# Patient Record
Sex: Female | Born: 1955 | Race: Black or African American | Hispanic: No | Marital: Married | Smoking: Never smoker
Health system: Southern US, Community
[De-identification: ages and names within clinical notes are randomized; demographics above are authoritative.]

## PROBLEM LIST (undated history)

## (undated) DIAGNOSIS — E785 Hyperlipidemia, unspecified: Secondary | ICD-10-CM

## (undated) DIAGNOSIS — D649 Anemia, unspecified: Secondary | ICD-10-CM

---

## 2018-05-06 ENCOUNTER — Other Ambulatory Visit: Payer: Self-pay | Admitting: Family Medicine

## 2018-05-06 DIAGNOSIS — Z1231 Encounter for screening mammogram for malignant neoplasm of breast: Secondary | ICD-10-CM

## 2018-06-05 ENCOUNTER — Ambulatory Visit
Admission: RE | Admit: 2018-06-05 | Discharge: 2018-06-05 | Disposition: A | Discharge status: Home / Self Care | Payer: BC Managed Care – PPO | Source: Ambulatory Visit | Attending: Family Medicine | Admitting: Family Medicine

## 2018-06-05 DIAGNOSIS — Z1231 Encounter for screening mammogram for malignant neoplasm of breast: Secondary | ICD-10-CM

## 2019-06-09 ENCOUNTER — Other Ambulatory Visit: Payer: Self-pay | Admitting: *Deleted

## 2019-06-09 ENCOUNTER — Other Ambulatory Visit: Payer: Self-pay | Admitting: Anesthesiology

## 2019-06-09 ENCOUNTER — Other Ambulatory Visit: Payer: Self-pay

## 2019-06-09 ENCOUNTER — Other Ambulatory Visit: Payer: Self-pay | Admitting: Family Medicine

## 2019-06-09 ENCOUNTER — Other Ambulatory Visit: Payer: Self-pay | Admitting: Diagnostic Radiology

## 2019-06-09 ENCOUNTER — Other Ambulatory Visit: Payer: Self-pay | Admitting: Internal Medicine

## 2019-06-09 DIAGNOSIS — Z1231 Encounter for screening mammogram for malignant neoplasm of breast: Secondary | ICD-10-CM

## 2019-06-30 ENCOUNTER — Ambulatory Visit
Admission: RE | Admit: 2019-06-30 | Discharge: 2019-06-30 | Disposition: A | Discharge status: Home / Self Care | Payer: BC Managed Care – PPO | Source: Ambulatory Visit | Attending: Anesthesiology | Admitting: Anesthesiology

## 2019-06-30 ENCOUNTER — Other Ambulatory Visit: Payer: Self-pay

## 2019-06-30 DIAGNOSIS — Z1231 Encounter for screening mammogram for malignant neoplasm of breast: Secondary | ICD-10-CM

## 2019-12-29 ENCOUNTER — Other Ambulatory Visit: Payer: Self-pay

## 2019-12-29 ENCOUNTER — Encounter (HOSPITAL_BASED_OUTPATIENT_CLINIC_OR_DEPARTMENT_OTHER): Payer: Self-pay | Admitting: Obstetrics & Gynecology

## 2019-12-29 NOTE — H&P (Signed)
64yo PM female who presents for preop appt for [facility], D&C polypectomy on 5/12 due to:         -PMB: Today she notes that she actually has not had any bleeding recently. Previously reported that since about 2017- she will note light pink spotting any time she lifts something heavy. Typically the heavier the object the more spotting she may have. This never lasts more than 1/2 day and does not cause her to have to change/wear a pad (she wears a pad anyway). Denies spotting or bleeding any other time. She does not see blood if she exercises only when lifting. Denies vaginal discharge, itching or irritation. Denies pelvic or abdominal pain. Reports no other acute complaints        Work up:         EMB: (11/15/19): benign endometrioid type polyp        [place] (11/10/19): 8.6cm retroverted uterus with 3cm ant submucosal fibroid, post 1.4cm fibroid. Thickened endometrium 1.27cm- no BF noted, no discrete mass. Normal ovaries bilaterally. No adnexal masses seen        Per pt prior pap 2019  Current Medications  TakingRosuvastatin Calcium 5 MG Tablet 1 tablet Orally Once a day    Vitamin D 25 MCG (1000 UT) Tablet 1 tablet Orally Once a day    Iron 325 (65 Fe) MG Tablet 1 tablet Orally Once a day    Vitamin B12 100 MCG Tablet as directed Orally    Zinc 50 MG Tablet 1 tablet Orally Once a day    Fenofibrate 145 MG Tablet 1 tablet Orally Once a day    Medication List reviewed and reconciled with the patient       Past Medical History       Mixed Hyperlipidemia- on fenofibrate.        Anemia- low iron- sinc echildhood.        vitamin D def.        colonoscopy in 2016 in [facility] was good per patient.            Surgical History  none       Family History  Father: deceased 69 yrs, diagnosed with Coronary artery disease  Mother: deceased 21 yrs, childbirth  Brother 1: alive, Diabetes, Hypertension, Coronary artery disease  Sister 1: alive, Diabetes  1 brother(s) .        Social History  General:         Tobacco use               cigarettes:  Never smoked             Tobacco history last updated  12/28/2019       no EXPOSURE TO PASSIVE SMOKE.        Alcohol: yes.        no Caffeine.        no Recreational drug use.        DIET: lacking.        no Exercise.        DENTAL CARE: good.        Marital Status: married.        Children: Boys, 1, girls, 1.        EDUCATION: College.        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        COMMUNICATION BARRIERS: none.     Gyn History  Sexual  activity currently sexually active.   Periods : none , but random light spotting when lifting heavy things per pt .   LMP over 15 years ago .   Last pap smear date 06-2018 per pt .   Last mammogram date 06-2019 per pt .   Menarche unsure.   menopause early 44s per pt .      OB History  Number of pregnancies  2.   Pregnancy # 1  vaginal delivery.   Pregnancy # 2  vaginal delivery.      Allergies  Atorvastatin Calcium: muscle pain - Side Effects      Hospitalization/Major Diagnostic Procedure  No Hospitalization History.      Review of Systems  CONSTITUTIONAL:          no Chills.  no Fever.  no Night sweats.      HEENT:          Blurrred vision no, no.  no Double vision.      CARDIOLOGY:          no Chest pain.      RESPIRATORY:          no Shortness of breath.  no Cough.      UROLOGY:          no Urinary urgency.  no Urinary frequency.  no Urinary incontinence.      GASTROENTEROLOGY:          no Abdominal pain.  no Appetite change.  no Change in bowel movements.      FEMALE REPRODUCTIVE:          See HPI for details.  no Breast lumps or discharge.  no Breast pain.      NEUROLOGY:          no Dizziness.  no Headache.  no Loss of consciousness.      PSYCHOLOGY:          no Anxiety.  no Depression.      SKIN:          no Rash.  no Hives.      HEMATOLOGY/LYMPH:          no Anemia.  no Fatigue.   Using Blood Thinners no, no.          Vital Signs  Wt 198.8, Ht 63, BMI 35.21, Temp 97.6, Pulse sitting 94, BP sitting 122/80.    Examination  General Examination:        CONSTITUTIONAL: well developed, well nourished.         SKIN: warm and dry, no rashes.         NECK: supple, normal appearance.         LUNGS: clear to auscultation bilaterally, no wheezes, rhonchi, rales.         HEART: no murmurs, regular rate and rhythm.         ABDOMEN:  soft and not tender, no rebound, no rigidity.         MUSCULOSKELETAL  no calf tenderness bilaterally.         EXTREMITIES:  no edema present.         NEUROLOGIC EXAM:  alert and oriented x 3.         PSYCH:  appropriate mood and affect.     A/P: 64yo PM female who presents for hysteroscopy, D&C, myosure polypectomy -NPO -LR @ 125cc/hr -SCDs to OR -Risk/benefit and alternative reviewed including but not limited to risk of bleeding, infection and injury. Questions and concerns  were addressed and she desires to proceed  Janyth Pupa, DO [phone] (cell) [phone] (office)

## 2020-01-01 ENCOUNTER — Other Ambulatory Visit (HOSPITAL_COMMUNITY)
Admission: RE | Admit: 2020-01-01 | Discharge: 2020-01-01 | Disposition: A | Discharge status: Home / Self Care | Payer: BC Managed Care – PPO | Source: Ambulatory Visit | Attending: Obstetrics & Gynecology | Admitting: Obstetrics & Gynecology

## 2020-01-01 DIAGNOSIS — Z01812 Encounter for preprocedural laboratory examination: Secondary | ICD-10-CM | POA: Diagnosis present

## 2020-01-01 DIAGNOSIS — Z20822 Contact with and (suspected) exposure to covid-19: Secondary | ICD-10-CM | POA: Insufficient documentation

## 2020-01-01 LAB — SARS CORONAVIRUS 2 (TAT 6-24 HRS): SARS Coronavirus 2: NEGATIVE

## 2020-01-03 NOTE — Progress Notes (Signed)

## 2020-01-05 ENCOUNTER — Encounter (HOSPITAL_BASED_OUTPATIENT_CLINIC_OR_DEPARTMENT_OTHER): Payer: Self-pay | Admitting: Obstetrics & Gynecology

## 2020-01-05 ENCOUNTER — Ambulatory Visit (HOSPITAL_BASED_OUTPATIENT_CLINIC_OR_DEPARTMENT_OTHER): Payer: BC Managed Care – PPO | Admitting: Anesthesiology

## 2020-01-05 ENCOUNTER — Other Ambulatory Visit: Payer: Self-pay

## 2020-01-05 ENCOUNTER — Ambulatory Visit (HOSPITAL_BASED_OUTPATIENT_CLINIC_OR_DEPARTMENT_OTHER)
Admission: RE | Admit: 2020-01-05 | Discharge: 2020-01-05 | Disposition: A | Discharge status: Home / Self Care | Payer: BC Managed Care – PPO | Attending: Obstetrics & Gynecology | Admitting: Obstetrics & Gynecology

## 2020-01-05 ENCOUNTER — Encounter (HOSPITAL_BASED_OUTPATIENT_CLINIC_OR_DEPARTMENT_OTHER)
Admission: RE | Disposition: A | Discharge status: Home / Self Care | Payer: Self-pay | Source: Home / Self Care | Attending: Obstetrics & Gynecology

## 2020-01-05 DIAGNOSIS — N950 Postmenopausal bleeding: Secondary | ICD-10-CM | POA: Diagnosis present

## 2020-01-05 DIAGNOSIS — E559 Vitamin D deficiency, unspecified: Secondary | ICD-10-CM | POA: Diagnosis not present

## 2020-01-05 DIAGNOSIS — Z79899 Other long term (current) drug therapy: Secondary | ICD-10-CM | POA: Insufficient documentation

## 2020-01-05 DIAGNOSIS — Z888 Allergy status to other drugs, medicaments and biological substances status: Secondary | ICD-10-CM | POA: Insufficient documentation

## 2020-01-05 DIAGNOSIS — N840 Polyp of corpus uteri: Secondary | ICD-10-CM | POA: Insufficient documentation

## 2020-01-05 DIAGNOSIS — D649 Anemia, unspecified: Secondary | ICD-10-CM | POA: Diagnosis not present

## 2020-01-05 DIAGNOSIS — E782 Mixed hyperlipidemia: Secondary | ICD-10-CM | POA: Diagnosis not present

## 2020-01-05 HISTORY — PX: DILATATION & CURETTAGE/HYSTEROSCOPY WITH MYOSURE: SHX6511

## 2020-01-05 HISTORY — DX: Hyperlipidemia, unspecified: E78.5

## 2020-01-05 HISTORY — DX: Anemia, unspecified: D64.9

## 2020-01-05 SURGERY — DILATATION & CURETTAGE/HYSTEROSCOPY WITH MYOSURE
Anesthesia: General | Site: Vagina

## 2020-01-05 MED ORDER — FENTANYL CITRATE (PF) 100 MCG/2ML IJ SOLN: 25.0000 ug | INTRAMUSCULAR | Status: DC | PRN

## 2020-01-05 MED ORDER — PROPOFOL 10 MG/ML IV BOLUS
INTRAVENOUS | Status: DC | PRN
  Administered 2020-01-05: 200 mg via INTRAVENOUS

## 2020-01-05 MED ORDER — LIDOCAINE HCL (CARDIAC) PF 100 MG/5ML IV SOSY
PREFILLED_SYRINGE | INTRAVENOUS | Status: DC | PRN
  Administered 2020-01-05: 50 mg via INTRAVENOUS

## 2020-01-05 MED ORDER — ONDANSETRON HCL 4 MG/2ML IJ SOLN
INTRAMUSCULAR | Status: DC | PRN
  Administered 2020-01-05: 4 mg via INTRAVENOUS

## 2020-01-05 MED ORDER — MIDAZOLAM HCL 5 MG/5ML IJ SOLN
INTRAMUSCULAR | Status: DC | PRN
  Administered 2020-01-05: 2 mg via INTRAVENOUS

## 2020-01-05 MED ORDER — OXYCODONE HCL 5 MG/5ML PO SOLN: 5.0000 mg | Freq: Once | ORAL | Status: DC | PRN

## 2020-01-05 MED ORDER — DIPHENHYDRAMINE HCL 50 MG/ML IJ SOLN
INTRAMUSCULAR | Status: DC | PRN
  Administered 2020-01-05: 6.25 mg via INTRAVENOUS

## 2020-01-05 MED ORDER — FENTANYL CITRATE (PF) 100 MCG/2ML IJ SOLN
INTRAMUSCULAR | Status: DC | PRN
  Administered 2020-01-05: 100 ug via INTRAVENOUS

## 2020-01-05 MED ORDER — LACTATED RINGERS IV SOLN: INTRAVENOUS | Status: DC

## 2020-01-05 MED ORDER — MIDAZOLAM HCL 2 MG/2ML IJ SOLN
INTRAMUSCULAR | Status: AC
  Filled 2020-01-05: qty 2

## 2020-01-05 MED ORDER — SODIUM CHLORIDE 0.9 % IR SOLN
Status: DC | PRN
  Administered 2020-01-05: 3000 mL

## 2020-01-05 MED ORDER — FENTANYL CITRATE (PF) 100 MCG/2ML IJ SOLN
INTRAMUSCULAR | Status: AC
  Filled 2020-01-05: qty 2

## 2020-01-05 MED ORDER — LIDOCAINE-EPINEPHRINE 1 %-1:100000 IJ SOLN
INTRAMUSCULAR | Status: DC | PRN
  Administered 2020-01-05: 20 mL

## 2020-01-05 MED ORDER — DEXAMETHASONE SODIUM PHOSPHATE 4 MG/ML IJ SOLN
INTRAMUSCULAR | Status: DC | PRN
  Administered 2020-01-05: 10 mg via INTRAVENOUS

## 2020-01-05 MED ORDER — OXYCODONE HCL 5 MG PO TABS: 5.0000 mg | ORAL_TABLET | Freq: Once | ORAL | Status: DC | PRN

## 2020-01-05 MED ORDER — ONDANSETRON HCL 4 MG/2ML IJ SOLN: 4.0000 mg | Freq: Once | INTRAMUSCULAR | Status: DC | PRN

## 2020-01-05 MED ORDER — PROPOFOL 10 MG/ML IV BOLUS
INTRAVENOUS | Status: AC
  Filled 2020-01-05: qty 20

## 2020-01-05 SURGICAL SUPPLY — 18 items
CATH ROBINSON RED A/P 16FR (CATHETERS) IMPLANT
DEVICE MYOSURE LITE (MISCELLANEOUS) ×2 IMPLANT
DEVICE MYOSURE REACH (MISCELLANEOUS) IMPLANT
DILATOR CANAL MILEX (MISCELLANEOUS) IMPLANT
GAUZE 4X4 16PLY RFD (DISPOSABLE) ×2 IMPLANT
GLOVE BIOGEL PI IND STRL 6.5 (GLOVE) ×1 IMPLANT
GLOVE BIOGEL PI IND STRL 7.0 (GLOVE) ×1 IMPLANT
GLOVE BIOGEL PI INDICATOR 6.5 (GLOVE) ×1
GLOVE BIOGEL PI INDICATOR 7.0 (GLOVE) ×1
GLOVE ECLIPSE 6.5 STRL STRAW (GLOVE) ×2 IMPLANT
GOWN STRL REUS W/TWL LRG LVL3 (GOWN DISPOSABLE) ×4 IMPLANT
KIT PROCEDURE FLUENT (KITS) ×2 IMPLANT
PACK VAGINAL MINOR WOMEN LF (CUSTOM PROCEDURE TRAY) ×2 IMPLANT
PAD OB MATERNITY 4.3X12.25 (PERSONAL CARE ITEMS) ×2 IMPLANT
PAD PREP 24X48 CUFFED NSTRL (MISCELLANEOUS) ×2 IMPLANT
SEAL ROD LENS SCOPE MYOSURE (ABLATOR) ×2 IMPLANT
SLEEVE SCD COMPRESS KNEE MED (MISCELLANEOUS) ×2 IMPLANT
TOWEL GREEN STERILE FF (TOWEL DISPOSABLE) ×4 IMPLANT

## 2020-01-05 NOTE — Anesthesia Postprocedure Evaluation (Signed)
Anesthesia Post Note  Patient: Casey Hines  Procedure(s) Performed: DILATATION & CURETTAGE/HYSTEROSCOPY WITH MYOSURE (N/A Vagina )     Patient location during evaluation: PACU Anesthesia Type: General Level of consciousness: awake and alert Pain management: pain level controlled Vital Signs Assessment: post-procedure vital signs reviewed and stable Respiratory status: spontaneous breathing, nonlabored ventilation, respiratory function stable and patient connected to nasal cannula oxygen Cardiovascular status: blood pressure returned to baseline and stable Postop Assessment: no apparent nausea or vomiting Anesthetic complications: no    Last Vitals:  Vitals:   01/05/20 1245 01/05/20 1300  BP: 129/76 126/69  Pulse: 76 64  Resp: 12 14  Temp:  36.5 C  SpO2: 100% 100%    Last Pain:  Vitals:   01/05/20 1300  TempSrc:   PainSc: 0-No pain                 Mariesha Venturella COKER

## 2020-01-05 NOTE — Discharge Instructions (Addendum)
HOME INSTRUCTIONS  Please note any unusual or excessive bleeding, pain, swelling. Mild dizziness or drowsiness are normal for about 24 hours after surgery.   Shower when comfortable  Restrictions: No driving for 24 hours or while taking pain medications.  Activity:  No heavy lifting (> 10 lbs), nothing in vagina (no tampons, douching, or intercourse) x 1-2 weeks; no tub baths for 1-2 weeks Vaginal spotting is expected but if your bleeding is heavy, period like,  please call the office   Diet:  You may return to your regular diet.  Do not eat large meals.  Eat small frequent meals throughout the day.  Continue to drink a good amount of water at least 6-8 glasses of water per day, hydration is very important for the healing process.  Pain Management: Take Motrin and/or Tylenol as prescribed/needed for pain.  Always take prescription pain medication with food, it may cause constipation, increase fluids and fiber and you may want to take an over-the-counter stool softener like Colace as needed up to 2x a day.    Alcohol -- Avoid for 24 hours and while taking pain medications.  Nausea: Take sips of ginger ale or soda  Fever -- Call physician if temperature over 101 degrees  Follow up:  If you do not already have a follow up appointment scheduled, please call the office at [phone].  If you experience fever (a temperature greater than 100.4), pain unrelieved by pain medication, shortness of breath, swelling of a single leg, or any other symptoms which are concerning to you please the office immediately.    Post Anesthesia Home Care Instructions  Activity: Get plenty of rest for the remainder of the day. A responsible individual must stay with you for 24 hours following the procedure.  For the next 24 hours, DO NOT: -Drive a car -Advertising copywriter -Drink alcoholic beverages -Take any medication unless instructed by your physician -Make any legal decisions or sign important  papers.  Meals: Start with liquid foods such as gelatin or soup. Progress to regular foods as tolerated. Avoid greasy, spicy, heavy foods. If nausea and/or vomiting occur, drink only clear liquids until the nausea and/or vomiting subsides. Call your physician if vomiting continues.  Special Instructions/Symptoms: Your throat may feel dry or sore from the anesthesia or the breathing tube placed in your throat during surgery. If this causes discomfort, gargle with warm salt water. The discomfort should disappear within 24 hours.  If you had a scopolamine patch placed behind your ear for the management of post- operative nausea and/or vomiting:  1. The medication in the patch is effective for 72 hours, after which it should be removed.  Wrap patch in a tissue and discard in the trash. Wash hands thoroughly with soap and water. 2. You may remove the patch earlier than 72 hours if you experience unpleasant side effects which may include dry mouth, dizziness or visual disturbances. 3. Avoid touching the patch. Wash your hands with soap and water after contact with the patch.     Call your surgeon if you experience:   1.  Fever over 101.0. 2.  Inability to urinate. 3.  Nausea and/or vomiting. 4.  Extreme swelling or bruising at the surgical site. 5.  Continued bleeding from the incision. 6.  Increased pain, redness or drainage from the incision. 7.  Problems related to your pain medication. 8.  Any problems and/or concerns

## 2020-01-05 NOTE — Transfer of Care (Signed)
Immediate Anesthesia Transfer of Care Note  Patient: Casey Hines  Procedure(s) Performed: DILATATION & CURETTAGE/HYSTEROSCOPY WITH MYOSURE (N/A Vagina )  Patient Location: PACU  Anesthesia Type:General  Level of Consciousness: sedated  Airway & Oxygen Therapy: Patient Spontanous Breathing and Patient connected to face mask oxygen  Post-op Assessment: Report given to RN and Post -op Vital signs reviewed and stable  Post vital signs: Reviewed and stable  Last Vitals:  Vitals Value Taken Time  BP 126/71 01/05/20 1206  Temp    Pulse 84 01/05/20 1208  Resp 15 01/05/20 1208  SpO2 98 % 01/05/20 1208  Vitals shown include unvalidated device data.  Last Pain:  Vitals:   01/05/20 1036  TempSrc: Oral  PainSc: 0-No pain      Patients Stated Pain Goal: 3 (01/05/20 1036)  Complications: No apparent anesthesia complications

## 2020-01-05 NOTE — Interval H&P Note (Signed)
History and Physical Interval Note:  01/05/2020 11:06 AM  Casey Hines  has presented today for surgery, with the diagnosis of N84.0 Uterine polyp N95.0 postmenopausal bleeding.  The various methods of treatment have been discussed with the patient and family. After consideration of risks, benefits and other options for treatment, the patient has consented to  Procedure(s): DILATATION & CURETTAGE/HYSTEROSCOPY WITH MYOSURE (N/A) as a surgical intervention.  The patient's history has been reviewed, patient examined, no change in status, stable for surgery.  I have reviewed the patient's chart and labs.  Questions were answered to the patient's satisfaction.     Sharon Seller

## 2020-01-05 NOTE — Anesthesia Preprocedure Evaluation (Signed)

## 2020-01-05 NOTE — Op Note (Signed)
Operative Report  PreOp: Postmenopausal bleeding, uterine polyp PostOp: same Procedure:  Hysteroscopy, Dilation and Curettage, myosure polypectomy Surgeon: Dr. Myna Hidalgo Anesthesia: General, cervical block Complications:none EBL: 5cc UOP: 30cc IVF:800cc Discrepancy: 70cc  Findings: 8cm uterus with two small polyps noted, both ostia visualized Specimens: endometrial curettings with uterine polyps   Procedure: The patient was taken to the operating room where she underwent general anesthesia without difficulty. The patient was placed in a low lithotomy position using Allen stirrups. She was prepped and draped in the normal sterile fashion. The bladder was drained using a red rubber urethral catheter. A sterile speculum was inserted into the vagina. 20cc of Lidocaine 1% with epi was injected into the cervix- 5cc at 2, 10, 4 and 7 o'clock.  A single tooth tenaculum was placed on the anterior lip of the cervix. The uterus was then sounded to 8cm. The diagnostic hysteroscope was then inserted without difficulty and noted to have the findings as listed above. Visualization was achieved using NS as a distending medium.  Myosure was inserted and resection of the uterine polyps was completed.  The hysteroscope was removed and sharp curettage was performed. The tissue was sent to pathology. The hysteroscope was reinserted, no uterine perforation was seen. All instrument were then removed. Hemostasis was observed at the cervical site. he patient was repositioned to the supine position. The patient tolerated the procedure without any complications and taken to recovery in stable condition.   Myna Hidalgo, DO [phone] (pager) [phone] (office)

## 2020-01-05 NOTE — Anesthesia Procedure Notes (Signed)
Procedure Name: LMA Insertion Date/Time: 01/05/2020 11:34 AM Performed by: Ronnette Hila, CRNA Pre-anesthesia Checklist: Patient identified, Emergency Drugs available, Suction available and Patient being monitored Patient Re-evaluated:Patient Re-evaluated prior to induction Oxygen Delivery Method: Circle system utilized Preoxygenation: Pre-oxygenation with 100% oxygen Induction Type: IV induction Ventilation: Mask ventilation without difficulty LMA: LMA inserted LMA Size: 4.0 Number of attempts: 1 Airway Equipment and Method: Bite block Placement Confirmation: positive ETCO2 Tube secured with: Tape Dental Injury: Teeth and Oropharynx as per pre-operative assessment

## 2020-01-06 ENCOUNTER — Encounter: Payer: Self-pay | Admitting: *Deleted

## 2020-01-06 LAB — SURGICAL PATHOLOGY

## 2020-05-29 ENCOUNTER — Other Ambulatory Visit (HOSPITAL_COMMUNITY): Payer: Self-pay

## 2020-05-29 ENCOUNTER — Ambulatory Visit (HOSPITAL_COMMUNITY)
Admission: RE | Admit: 2020-05-29 | Discharge: 2020-05-29 | Disposition: A | Discharge status: Home / Self Care | Payer: Medicare Other | Source: Ambulatory Visit | Attending: Pulmonary Disease | Admitting: Pulmonary Disease

## 2020-05-29 ENCOUNTER — Other Ambulatory Visit: Payer: Self-pay | Admitting: Physician Assistant

## 2020-05-29 DIAGNOSIS — U071 COVID-19: Secondary | ICD-10-CM

## 2020-05-29 DIAGNOSIS — Z23 Encounter for immunization: Secondary | ICD-10-CM | POA: Diagnosis not present

## 2020-05-29 MED ORDER — DIPHENHYDRAMINE HCL 50 MG/ML IJ SOLN: 50.0000 mg | Freq: Once | INTRAMUSCULAR | Status: DC | PRN

## 2020-05-29 MED ORDER — SODIUM CHLORIDE 0.9 % IV SOLN
1200.0000 mg | Freq: Once | INTRAVENOUS | Status: AC
  Administered 2020-05-29: 1200 mg via INTRAVENOUS

## 2020-05-29 MED ORDER — ALBUTEROL SULFATE HFA 108 (90 BASE) MCG/ACT IN AERS: 2.0000 | INHALATION_SPRAY | Freq: Once | RESPIRATORY_TRACT | Status: DC | PRN

## 2020-05-29 MED ORDER — SODIUM CHLORIDE 0.9 % IV SOLN: INTRAVENOUS | Status: DC | PRN

## 2020-05-29 MED ORDER — FAMOTIDINE IN NACL 20-0.9 MG/50ML-% IV SOLN: 20.0000 mg | Freq: Once | INTRAVENOUS | Status: DC | PRN

## 2020-05-29 MED ORDER — EPINEPHRINE 0.3 MG/0.3ML IJ SOAJ: 0.3000 mg | Freq: Once | INTRAMUSCULAR | Status: DC | PRN

## 2020-05-29 MED ORDER — METHYLPREDNISOLONE SODIUM SUCC 125 MG IJ SOLR: 125.0000 mg | Freq: Once | INTRAMUSCULAR | Status: DC | PRN

## 2020-05-29 NOTE — Progress Notes (Signed)
I connected by phone with Casey Hines on 05/29/2020 at 9:34 AM to discuss the potential use of a new treatment for mild to moderate COVID-19 viral infection in non-hospitalized patients.  This patient is a 64 y.o. female that meets the FDA criteria for Emergency Use Authorization of COVID monoclonal antibody casirivimab/imdevimab.  Has a (+) direct SARS-CoV-2 viral test result  Has mild or moderate COVID-19   Is NOT hospitalized due to COVID-19  Is within 10 days of symptom onset  Has at least one of the high risk factor(s) for progression to severe COVID-19 and/or hospitalization as defined in EUA.  Specific high risk criteria : Older age (>/= 65 yo) and BMI > 25   I have spoken and communicated the following to the patient or parent/caregiver regarding COVID monoclonal antibody treatment:  1. FDA has authorized the emergency use for the treatment of mild to moderate COVID-19 in adults and pediatric patients with positive results of direct SARS-CoV-2 viral testing who are 61 years of age and older weighing at least 40 kg, and who are at high risk for progressing to severe COVID-19 and/or hospitalization.  2. The significant known and potential risks and benefits of COVID monoclonal antibody, and the extent to which such potential risks and benefits are unknown.  3. Information on available alternative treatments and the risks and benefits of those alternatives, including clinical trials.  4. Patients treated with COVID monoclonal antibody should continue to self-isolate and use infection control measures (e.g., wear mask, isolate, social distance, avoid sharing personal items, clean and disinfect high touch surfaces, and frequent handwashing) according to CDC guidelines.   5. The patient or parent/caregiver has the option to accept or refuse COVID monoclonal antibody treatment.  After reviewing this information with the patient, the patient has agreed to receive one of the  available covid 19 monoclonal antibodies and will be provided an appropriate fact sheet prior to infusion.  Sx onset 9/28. Set up for infusion on 10/4 @ 4:30pm. Directions given to [facility]. Pt is aware that insurance will be charged an infusion fee. Pt is fully vaccinated.   Cline Crock 05/29/2020 9:34 AM

## 2020-05-29 NOTE — Discharge Instructions (Signed)

## 2020-05-29 NOTE — Progress Notes (Signed)
  Diagnosis: COVID-19  Physician: Dr. Patrick  Wright  Procedure: [facility] Med: casirivimab\imdevimab infusion - Provided patient with casirivimab\imdevimab fact sheet for patients, parents and caregivers prior to infusion.  Complications: No immediate complications noted.  Discharge: Discharged home   Samah Lapiana Ortiz 05/29/2020  

## 2020-07-05 ENCOUNTER — Other Ambulatory Visit: Payer: Self-pay | Admitting: Internal Medicine

## 2020-07-05 DIAGNOSIS — Z1231 Encounter for screening mammogram for malignant neoplasm of breast: Secondary | ICD-10-CM

## 2020-07-12 ENCOUNTER — Ambulatory Visit
Admission: RE | Admit: 2020-07-12 | Discharge: 2020-07-12 | Disposition: A | Discharge status: Home / Self Care | Payer: Medicare Other | Source: Ambulatory Visit | Attending: Internal Medicine | Admitting: Internal Medicine

## 2020-07-12 ENCOUNTER — Other Ambulatory Visit: Payer: Self-pay

## 2020-07-12 DIAGNOSIS — Z1231 Encounter for screening mammogram for malignant neoplasm of breast: Secondary | ICD-10-CM

## 2020-07-18 ENCOUNTER — Other Ambulatory Visit: Payer: Self-pay | Admitting: Internal Medicine

## 2020-07-18 DIAGNOSIS — R928 Other abnormal and inconclusive findings on diagnostic imaging of breast: Secondary | ICD-10-CM

## 2020-08-01 ENCOUNTER — Other Ambulatory Visit: Payer: Self-pay

## 2020-08-01 ENCOUNTER — Ambulatory Visit
Admission: RE | Admit: 2020-08-01 | Discharge: 2020-08-01 | Disposition: A | Discharge status: Home / Self Care | Payer: Medicare Other | Source: Ambulatory Visit | Attending: Internal Medicine | Admitting: Internal Medicine

## 2020-08-01 DIAGNOSIS — R928 Other abnormal and inconclusive findings on diagnostic imaging of breast: Secondary | ICD-10-CM

## 2020-12-04 ENCOUNTER — Other Ambulatory Visit: Payer: Self-pay | Admitting: Internal Medicine

## 2020-12-04 DIAGNOSIS — E2839 Other primary ovarian failure: Secondary | ICD-10-CM

## 2021-05-17 ENCOUNTER — Other Ambulatory Visit: Payer: Self-pay

## 2021-05-17 ENCOUNTER — Ambulatory Visit
Admission: RE | Admit: 2021-05-17 | Discharge: 2021-05-17 | Disposition: A | Discharge status: Home / Self Care | Payer: Medicare Other | Source: Ambulatory Visit | Attending: Internal Medicine | Admitting: Internal Medicine

## 2021-05-17 DIAGNOSIS — E2839 Other primary ovarian failure: Secondary | ICD-10-CM

## 2022-07-14 IMAGING — MG MM DIGITAL DIAGNOSTIC UNILAT*L* W/ TOMO W/ CAD
4 series · 4 of 12 positions shown · non-contrast
Comparison: Previous exam(s).

CLINICAL DATA: 64-year-old female for further evaluation of
possible LEFT breast mass on screening mammogram.

EXAM:
DIGITAL DIAGNOSTIC LEFT MAMMOGRAM WITH CAD AND TOMO
ULTRASOUND LEFT BREAST

[L MLO synth-2D]
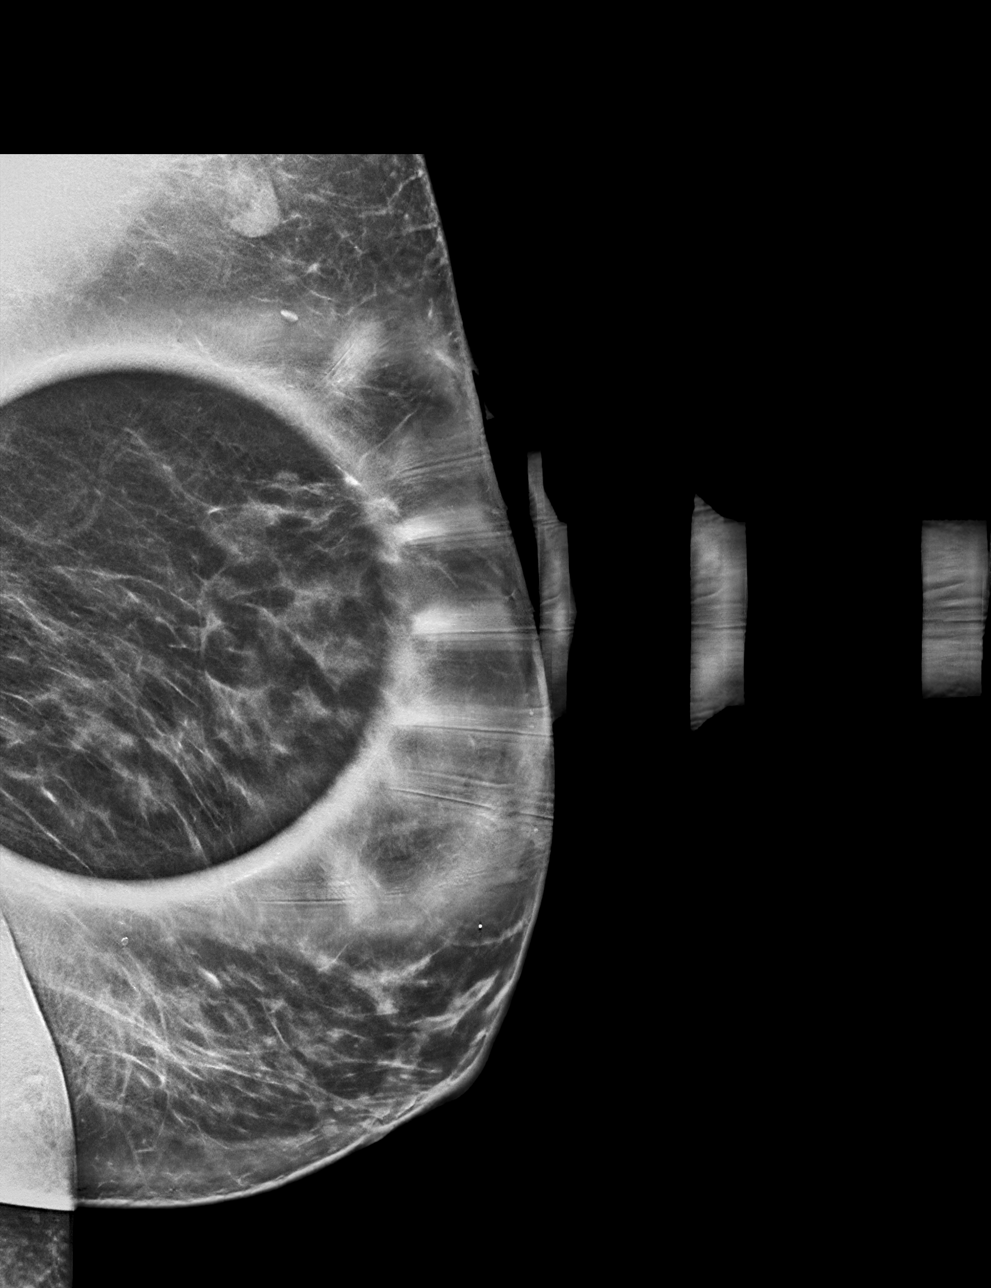

[L CC synth-2D]
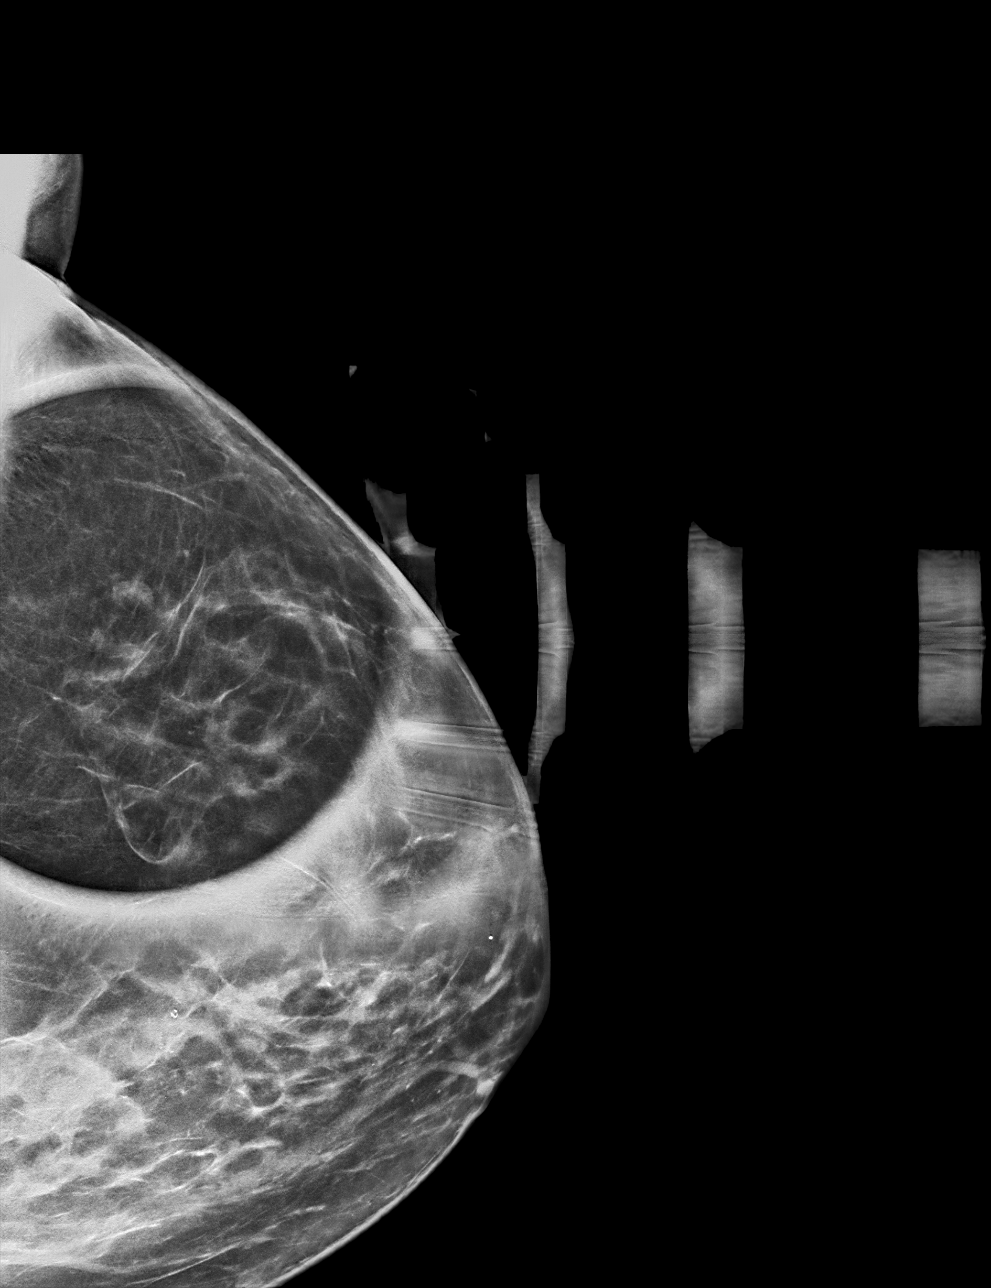

[L MLO tomo · tomo slice 32/63.0]
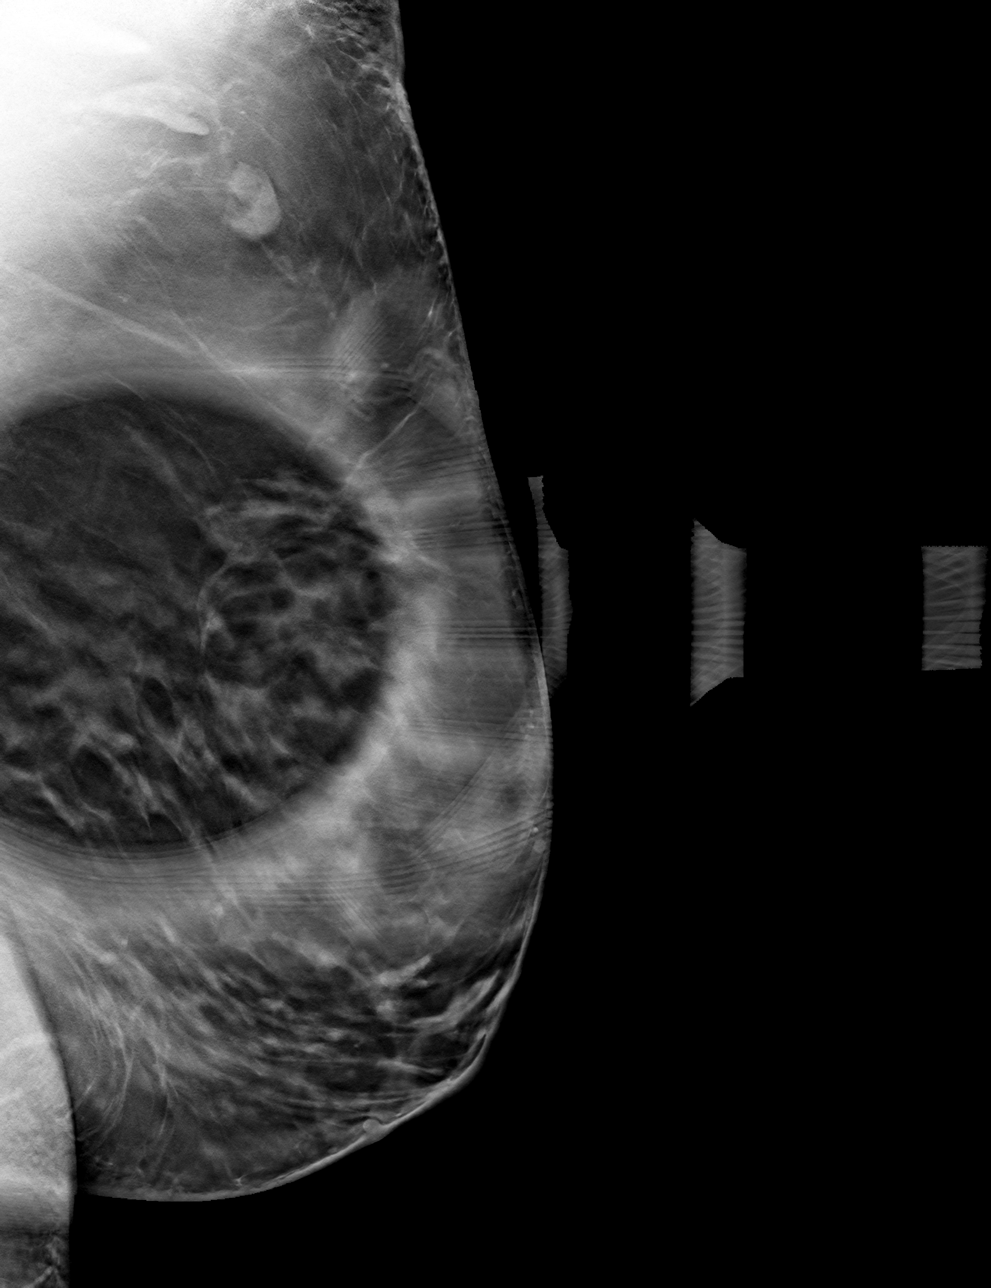

[L CC tomo · tomo slice 31/60.0]
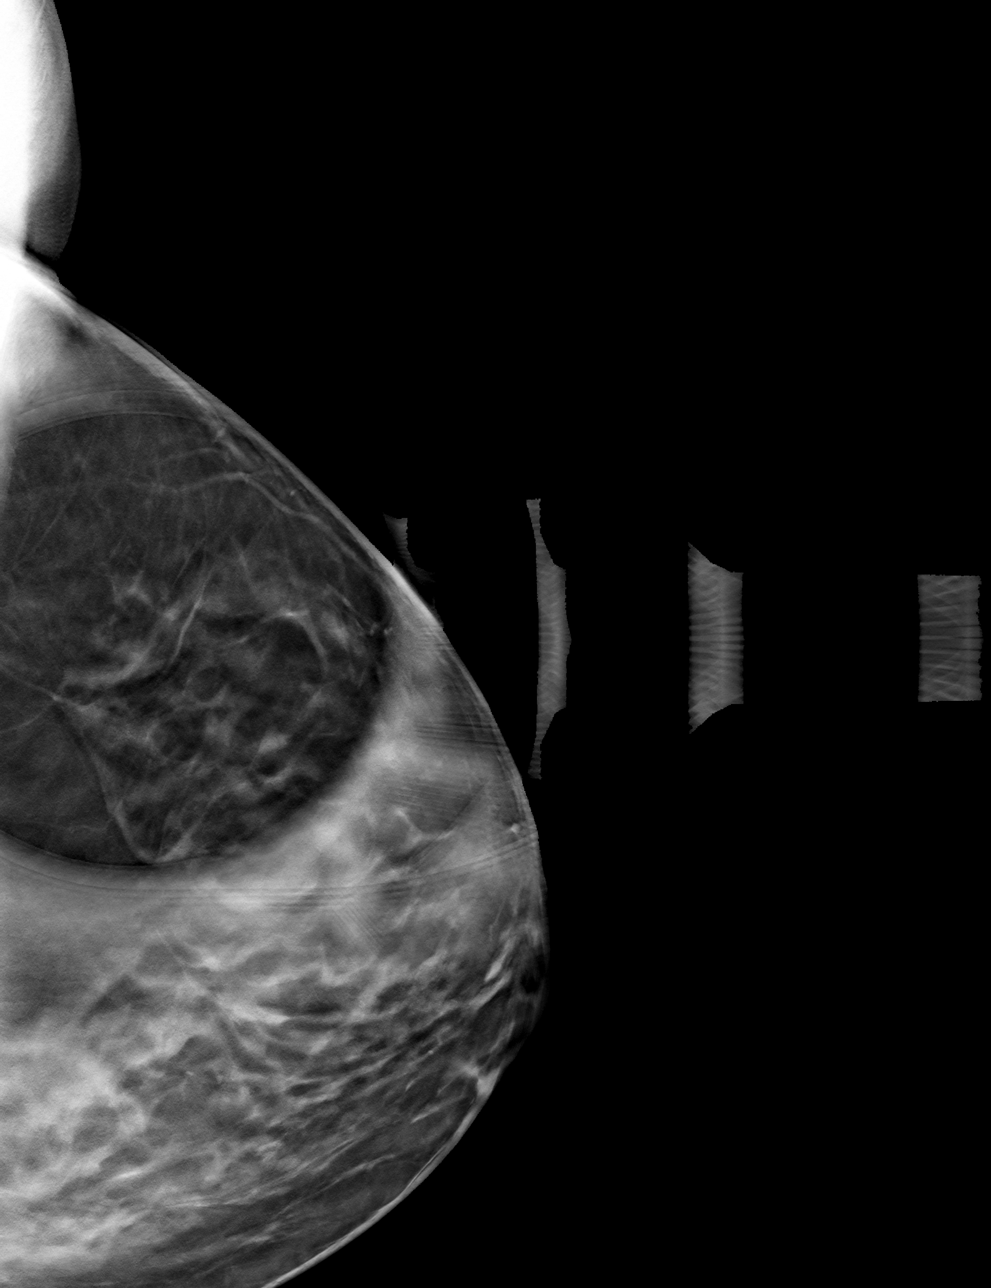

[4 of 12 positions shown; findings below may reference images not displayed]

ACR Breast Density Category b: There are scattered areas of
fibroglandular density.
FINDINGS: 2D/3D spot compression views of the LEFT breast demonstrate a
persistent circumscribed oval mass within the OUTER LEFT breast,
posterior depth.

Mammographic images were processed with CAD.

Targeted ultrasound is performed, showing several benign cysts
within the OUTER LEFT breast. A 0.4 x 0.3 x 0.6 cm benign simple
cyst at the 3 o'clock position of the LEFT breast 8 cm from the
nipple corresponds to the mammographic finding.
IMPRESSION: Benign cyst within the OUTER LEFT breast corresponding to the
screening study finding.

RECOMMENDATION:
Bilateral screening mammogram in 1 year.

I have discussed the findings and recommendations with the patient.
If applicable, a reminder letter will be sent to the patient
regarding the next appointment.

BI-RADS CATEGORY  2: Benign.
